# Patient Record
Sex: Female | Born: 1975 | Hispanic: Yes | Marital: Married | Smoking: Never smoker
Health system: Southern US, Community
[De-identification: ages and names within clinical notes are randomized; demographics above are authoritative.]

---

## 2005-01-16 ENCOUNTER — Ambulatory Visit: Payer: Self-pay | Admitting: Family Medicine

## 2005-03-13 ENCOUNTER — Inpatient Hospital Stay: Payer: Self-pay

## 2005-10-04 ENCOUNTER — Emergency Department: Payer: Self-pay | Admitting: Emergency Medicine

## 2010-01-09 ENCOUNTER — Emergency Department: Payer: Self-pay | Admitting: Emergency Medicine

## 2014-04-02 ENCOUNTER — Ambulatory Visit: Payer: Self-pay | Admitting: Family Medicine

## 2014-05-03 ENCOUNTER — Encounter: Payer: Self-pay | Admitting: Obstetrics and Gynecology

## 2018-07-27 ENCOUNTER — Ambulatory Visit
Admission: RE | Admit: 2018-07-27 | Discharge: 2018-07-27 | Disposition: A | Discharge status: Home / Self Care | Payer: Self-pay | Source: Ambulatory Visit | Attending: Family Medicine | Admitting: Family Medicine

## 2018-07-27 ENCOUNTER — Other Ambulatory Visit: Payer: Self-pay | Admitting: Family Medicine

## 2018-07-27 DIAGNOSIS — R0789 Other chest pain: Secondary | ICD-10-CM | POA: Insufficient documentation

## 2018-07-27 DIAGNOSIS — R079 Chest pain, unspecified: Secondary | ICD-10-CM

## 2018-08-22 ENCOUNTER — Encounter: Payer: Self-pay | Admitting: *Deleted

## 2018-08-22 ENCOUNTER — Ambulatory Visit
Admission: RE | Admit: 2018-08-22 | Discharge: 2018-08-22 | Disposition: A | Discharge status: Home / Self Care | Payer: Self-pay | Source: Ambulatory Visit | Attending: Oncology | Admitting: Oncology

## 2018-08-22 ENCOUNTER — Ambulatory Visit: Payer: Self-pay | Attending: Oncology | Admitting: *Deleted

## 2018-08-22 ENCOUNTER — Other Ambulatory Visit: Payer: Self-pay | Admitting: *Deleted

## 2018-08-22 VITALS — BP 132/93 | HR 90 | Temp 98.6°F | Ht 64.0 in | Wt 172.0 lb

## 2018-08-22 DIAGNOSIS — N63 Unspecified lump in unspecified breast: Secondary | ICD-10-CM

## 2018-08-22 DIAGNOSIS — R920 Mammographic microcalcification found on diagnostic imaging of breast: Secondary | ICD-10-CM

## 2018-08-22 NOTE — Patient Instructions (Addendum)
Gave patient hand-out, Women Staying Healthy, Active and Well from [facility], with education on breast health, pap smears, heart and colon health. 

## 2018-08-22 NOTE — Progress Notes (Signed)
  Subjective:     Patient ID: Ashley Orozco, female   DOB: Sep 20, 1976, 42 y.o.   MRN: [id number]  HPI   Review of Systems     Objective:   Physical Exam  Pulmonary/Chest: Right breast exhibits nipple discharge. Right breast exhibits no inverted nipple, no mass, no skin change and no tenderness. Left breast exhibits nipple discharge. Left breast exhibits no inverted nipple, no mass, no skin change and no tenderness.         Assessment:     42 year old Spanish speaking [place] referred to [facility] by Dr. Ephraim HamburgerMancheno at the [facility] for further evaluation of bilateral nipple discharge.  Lloyda, the interpreter present during the interview and exam.  Patient states she has had nipple discharge since the birth of her child 13 years ago.  States after breastfeeding her first child she still had a discharge on expression, and again after her last child 4 years ago.  States her husband states it taste "bitter".  Patient states it is bilateral and on expression only.  [facility] on 09/19/13 was a diagnostic mammogram for yellow nipple discharge.  It was read as a birads 1 mammogram.  Patient states her doctor did blood work and a scan and everything was normal.  She states they scanned her brain.  There are no notes from her primary care provider or results of labs or scans in Epic.  On clinical breast exam bilateral breast are tender to palpation.  There is an approximate 2 cm tender nodule at 11:00 right breast 1-2 cm from the nipple.  There is no discharge noted on exam today.  Taught self breast awareness.  She was encouraged not to express any discharge to see if it will resolve on its on.  Last pap on 03/22/18 was negative without HPV co-testing.  Next pap due in 2022.  Patient has been screened for eligibility.  She does not have any insurance, Medicare or Medicaid.  She also meets financial eligibility.  Hand-out given on the Affordable Care Act.   Risk Assessment    Risk Scores      08/22/2018   Last edited by: Jim LikeLambert,  M, RN   5-year risk: 0.3 %   Lifetime risk: 6.4 %            Plan:     Bilateral diagnostic mammogram and ultrasound ordered.  Patient encouraged to stop expressing any discharge.  Will follow-up per [facility] protocol.

## 2018-08-25 ENCOUNTER — Ambulatory Visit
Admission: RE | Admit: 2018-08-25 | Discharge: 2018-08-25 | Disposition: A | Discharge status: Home / Self Care | Payer: Self-pay | Source: Ambulatory Visit | Attending: Oncology | Admitting: Oncology

## 2018-08-25 DIAGNOSIS — R920 Mammographic microcalcification found on diagnostic imaging of breast: Secondary | ICD-10-CM

## 2018-08-25 HISTORY — PX: BREAST BIOPSY: SHX20

## 2018-08-26 ENCOUNTER — Encounter: Payer: Self-pay | Admitting: Family Medicine

## 2018-08-26 LAB — SURGICAL PATHOLOGY

## 2018-09-06 ENCOUNTER — Encounter: Payer: Self-pay | Admitting: *Deleted

## 2018-09-06 NOTE — Progress Notes (Signed)
Called patient via Onalee HuaDavid #[phone] on the Language Line to inform patient of her benign breast biopsy.  She is to return in one year for her annual screening.  [facility] to [place].

## 2018-10-03 ENCOUNTER — Encounter: Payer: Self-pay | Admitting: *Deleted

## 2018-10-03 ENCOUNTER — Ambulatory Visit: Payer: Self-pay | Attending: Oncology | Admitting: *Deleted

## 2018-10-03 ENCOUNTER — Encounter (INDEPENDENT_AMBULATORY_CARE_PROVIDER_SITE_OTHER): Payer: Self-pay

## 2018-10-03 VITALS — BP 121/75 | HR 82 | Temp 98.0°F | Ht 65.25 in | Wt 171.8 lb

## 2018-10-03 DIAGNOSIS — N644 Mastodynia: Secondary | ICD-10-CM

## 2018-10-03 NOTE — Progress Notes (Signed)
  Subjective:     Patient ID: Ashley Orozco, female   DOB: [birth year], 43 y.o.   MRN: [id number]  HPI   Review of Systems     Objective:   Physical Exam Chest:          Assessment:     43 year old Hispanic female returns to [facility] with c/o right breast pain.  Lloyda, the interpreter present during the interview and exam.  Patient was seen through our [facility] program on 08/22/18 as a referral from [facility] for bilateral nipple discharge, which the patient stated she has had for 13 years.  On clinical breast exam at that time a 2 cm nodule at 11:00 right breast was noted.  It was noted as a cluster of cysts on ultrasound.  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  Patient states she has had continuous right breast pain at the site of the noted cysts.  States the lump got larger and more painful prior to her period, and currently is smaller again. States the pain is sometimes a 7 on a 0/10 scale.  Explained that the waxing and waning of the size is normal.  Reviewed her ultrasound results showing a cluster of small cysts.  States "what can be done?".  Informed that sometimes with larger cyst if they are painful the fluid can be aspirated.  Informed patient that I am not sure with the small cluster of cyst if that is even possible.  She is pretty adamant she would like to have something done about the pain.  Reviewed warm compresses, decreasing salt intake or option to see a surgeon for further evaluation.  She would like to see a Careers adviser.      Plan:     Joellyn Quails to schedule patient to see Dr. Lemar Livings for further evaluation of right breast pain at the site of noted cysts.  Will follow-up per [facility] protocol.

## 2018-11-08 ENCOUNTER — Ambulatory Visit: Payer: Self-pay | Admitting: General Surgery

## 2018-11-10 ENCOUNTER — Encounter: Payer: Self-pay | Admitting: *Deleted

## 2018-11-10 NOTE — Progress Notes (Signed)
Per appointment notes.  The patient cancelled her appointment with Dr. Lemar Livings and did not want to reschedule.  Patient can call if she needs further assistance.

## 2019-03-09 ENCOUNTER — Encounter: Payer: Self-pay | Admitting: General Surgery

## 2020-02-07 ENCOUNTER — Other Ambulatory Visit: Payer: Self-pay | Admitting: *Deleted

## 2020-02-07 ENCOUNTER — Ambulatory Visit
Admission: RE | Admit: 2020-02-07 | Discharge: 2020-02-07 | Disposition: A | Discharge status: Home / Self Care | Payer: Self-pay | Source: Ambulatory Visit | Attending: Oncology | Admitting: Oncology

## 2020-02-07 ENCOUNTER — Ambulatory Visit: Payer: Self-pay | Attending: Oncology | Admitting: *Deleted

## 2020-02-07 ENCOUNTER — Encounter: Payer: Self-pay | Admitting: *Deleted

## 2020-02-07 ENCOUNTER — Other Ambulatory Visit: Payer: Self-pay

## 2020-02-07 VITALS — BP 123/81 | HR 90 | Temp 98.1°F | Ht 65.5 in | Wt 179.0 lb

## 2020-02-07 DIAGNOSIS — Z0000 Encounter for general adult medical examination without abnormal findings: Secondary | ICD-10-CM

## 2020-02-07 DIAGNOSIS — N63 Unspecified lump in unspecified breast: Secondary | ICD-10-CM

## 2020-02-07 NOTE — Progress Notes (Signed)
  Subjective:     Patient ID: Ashley Orozco, female   DOB: [birth year], 44 y.o.   MRN: [id number]  HPI   Review of Systems     Objective:   Physical Exam Chest:     Breasts: Breasts are asymmetrical.        Right: Tenderness present. No swelling, bleeding, inverted nipple, mass, nipple discharge or skin change.        Left: Tenderness present. No swelling, bleeding, inverted nipple, mass, nipple discharge or skin change.    Lymphadenopathy:     Upper Body:     Right upper body: No supraclavicular or axillary adenopathy.     Left upper body: No supraclavicular or axillary adenopathy.        Assessment:     44 year old Hispanic female returns to [facility] for annual screening.  Loyda, the interpreter present during the interview and exam.  Clinical breast exam with tenderness to palpation.  No dominant mass, skin changes, nipple discharge or lymphadenopathy.  Taught self breast awareness.  Patient encouraged to decrease caffeine intake. Last pap on 03/22/18 was negative without HPV co-testing.  Next pap due in 2022. Patient has been screened for eligibility.  She does not have any insurance, Medicare or Medicaid.  She also meets financial eligibility.   Risk Assessment    Risk Scores      02/07/2020 08/22/2018   Last edited by: Alta Corning, CMA Jim Like, RN   5-year risk: 0.4 % 0.3 %   Lifetime risk: 6.3 % 6.4 %            Plan:     Screening mammogram ordered.

## 2020-02-07 NOTE — Patient Instructions (Signed)
Gave patient hand-out, Women Staying Healthy, Active and Well from [facility], with education on breast health, pap smears, heart and colon health. 

## 2020-02-09 ENCOUNTER — Other Ambulatory Visit: Payer: Self-pay

## 2020-02-09 DIAGNOSIS — N63 Unspecified lump in unspecified breast: Secondary | ICD-10-CM

## 2020-02-16 ENCOUNTER — Ambulatory Visit
Admission: RE | Admit: 2020-02-16 | Discharge: 2020-02-16 | Disposition: A | Discharge status: Home / Self Care | Payer: Self-pay | Source: Ambulatory Visit | Attending: Medical | Admitting: Medical

## 2020-02-16 DIAGNOSIS — N63 Unspecified lump in unspecified breast: Secondary | ICD-10-CM

## 2020-02-20 ENCOUNTER — Other Ambulatory Visit: Payer: Self-pay | Admitting: *Deleted

## 2020-02-20 DIAGNOSIS — N63 Unspecified lump in unspecified breast: Secondary | ICD-10-CM

## 2020-02-21 ENCOUNTER — Other Ambulatory Visit: Payer: Self-pay | Admitting: Medical

## 2020-02-26 ENCOUNTER — Ambulatory Visit
Admission: RE | Admit: 2020-02-26 | Discharge: 2020-02-26 | Disposition: A | Discharge status: Home / Self Care | Payer: Self-pay | Source: Ambulatory Visit | Attending: Oncology | Admitting: Oncology

## 2020-02-26 DIAGNOSIS — N63 Unspecified lump in unspecified breast: Secondary | ICD-10-CM

## 2020-02-27 LAB — SURGICAL PATHOLOGY

## 2020-02-29 ENCOUNTER — Encounter: Payer: Self-pay | Admitting: *Deleted

## 2020-02-29 NOTE — Progress Notes (Signed)
Called patient via Orson Slick, the interpreter.  Gave patient her benign biopsy results.  She was encouraged to return in 1 year for her annual screening.

## 2020-06-05 ENCOUNTER — Other Ambulatory Visit: Payer: Self-pay

## 2020-06-05 DIAGNOSIS — Y93F2 Activity, caregiving, lifting: Secondary | ICD-10-CM | POA: Diagnosis not present

## 2020-06-05 DIAGNOSIS — Y9289 Other specified places as the place of occurrence of the external cause: Secondary | ICD-10-CM | POA: Diagnosis not present

## 2020-06-05 DIAGNOSIS — X500XXA Overexertion from strenuous movement or load, initial encounter: Secondary | ICD-10-CM | POA: Insufficient documentation

## 2020-06-05 DIAGNOSIS — S39012A Strain of muscle, fascia and tendon of lower back, initial encounter: Secondary | ICD-10-CM | POA: Insufficient documentation

## 2020-06-05 DIAGNOSIS — S3992XA Unspecified injury of lower back, initial encounter: Secondary | ICD-10-CM | POA: Diagnosis present

## 2020-06-05 NOTE — ED Triage Notes (Signed)
Pt was lifting heavy object at work and is now having lower back pain. States injury happened today.

## 2020-06-05 NOTE — ED Notes (Signed)
Pt wishes to file [employer]; pt is accompanied by supervisor, Tommie Raymond who states UDS is only testing needed for [employer].

## 2020-06-06 ENCOUNTER — Emergency Department
Admission: EM | Admit: 2020-06-06 | Discharge: 2020-06-06 | Disposition: A | Discharge status: Home / Self Care | Payer: Worker's Compensation | Attending: Emergency Medicine | Admitting: Emergency Medicine

## 2020-06-06 DIAGNOSIS — S39012A Strain of muscle, fascia and tendon of lower back, initial encounter: Secondary | ICD-10-CM

## 2020-06-06 MED ORDER — IBUPROFEN 800 MG PO TABS: 800.0000 mg | ORAL_TABLET | Freq: Three times a day (TID) | ORAL | 0 refills | Status: AC | PRN

## 2020-06-06 MED ORDER — CYCLOBENZAPRINE HCL 5 MG PO TABS: ORAL_TABLET | ORAL | 0 refills | Status: AC

## 2020-06-06 MED ORDER — KETOROLAC TROMETHAMINE 60 MG/2ML IM SOLN
30.0000 mg | Freq: Once | INTRAMUSCULAR | Status: AC
  Administered 2020-06-06: 30 mg via INTRAMUSCULAR
  Filled 2020-06-06: qty 2

## 2020-06-06 NOTE — Discharge Instructions (Signed)
1.  You may take medicines as needed for pain and muscle spasms (Motrin/Flexeril #15). °2.  Apply moist heat to affected area several times daily. °3.  Return to the ER for worsening symptoms, persistent vomiting, difficulty breathing or other concerns. °

## 2020-06-06 NOTE — ED Provider Notes (Signed)
[facility] Emergency Department Provider Note   ____________________________________________   First MD Initiated Contact with Patient 06/06/20 0254     (approximate)  I have reviewed the triage vital signs and the nursing notes.   HISTORY  Chief Complaint Back Pain  History obtained via tele-Spanish interpreter  HPI [facility] Ashley Orozco is a 44 y.o. female who presents to the ED from work with a chief complaint of low back pain.  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  Pain and muscle spasms progressed throughout the evening.  Denies fall.  Denies extremity weakness, bowel or bladder incontinence.      Past medical history None  There are no problems to display for this patient.   Past Surgical History:  Procedure Laterality Date  . BREAST BIOPSY Left 08/25/2018   Affirm Bx("x" clip)FIBROADENOMA WITH CALCIFICATIONS    Prior to Admission medications   Medication Sig Start Date End Date Taking? Authorizing Provider  cyclobenzaprine (FLEXERIL) 5 MG tablet 1 tablet every 8 hours as he did for muscle spasms 06/06/20   Irean Hong, MD  ibuprofen (ADVIL) 800 MG tablet Take 1 tablet (800 mg total) by mouth every 8 (eight) hours as needed for moderate pain. 06/06/20   Irean Hong, MD    Allergies Patient has no known allergies.  Family History  Problem Relation Age of Onset  . Breast cancer Neg Hx     Social History Social History   Tobacco Use  . Smoking status: Not on file  Substance Use Topics  . Alcohol use: Not on file  . Drug use: Not on file    Review of Systems  Constitutional: No fever/chills Eyes: No visual changes. ENT: No sore throat. Cardiovascular: Denies chest pain. Respiratory: Denies shortness of breath. Gastrointestinal: No abdominal pain.  No nausea, no vomiting.  No diarrhea.  No constipation. Genitourinary: Negative for  dysuria. Musculoskeletal: Positive for back pain. Skin: Negative for rash. Neurological: Negative for headaches, focal weakness or numbness.   ____________________________________________   PHYSICAL EXAM:  VITAL SIGNS: ED Triage Vitals  Enc Vitals Group     BP 06/05/20 2249 130/80     Pulse Rate 06/05/20 2249 83     Resp 06/05/20 2249 18     Temp 06/05/20 2249 98.5 F (36.9 C)     Temp Source 06/05/20 2249 Oral     SpO2 06/05/20 2249 97 %     Weight 06/05/20 2249 178 lb 9.2 oz (81 kg)     Height 06/05/20 2302 5\' 4"  (1.626 m)     Head Circumference --      Peak Flow --      Pain Score 06/05/20 2302 6     Pain Loc --      Pain Edu? --      Excl. in GC? --     Constitutional: Alert and oriented. Well appearing and in no acute distress. Eyes: Conjunctivae are normal. PERRL. EOMI. Head: Atraumatic. Nose: Atraumatic. Mouth/Throat: Mucous membranes are moist.  No dental malocclusion.   Neck: No stridor.  No cervical spine tenderness to palpation. Cardiovascular: Normal rate, regular rhythm. Grossly normal heart sounds.  Good peripheral circulation. Respiratory: Normal respiratory effort.  No retractions. Lungs CTAB. Gastrointestinal: Soft and nontender. No distention. No abdominal bruits. No CVA tenderness. Musculoskeletal: No spinal tenderness to palpation.  Paraspinal lumbar muscle spasms.  Negative straight leg raise.  No lower  extremity tenderness nor edema.  No joint effusions. Neurologic:  Normal speech and language. No gross focal neurologic deficits are appreciated. No gait instability. Skin:  Skin is warm, dry and intact. No rash noted. Psychiatric: Mood and affect are normal. Speech and behavior are normal.  ____________________________________________   LABS (all labs ordered are listed, but only abnormal results are displayed)  Labs Reviewed - No data to  display ____________________________________________  EKG  None ____________________________________________  RADIOLOGY  ED MD interpretation: None  Official radiology report(s): No results found.  ____________________________________________   PROCEDURES  Procedure(s) performed (including Critical Care):  Procedures   ____________________________________________   INITIAL IMPRESSION / ASSESSMENT AND PLAN / ED COURSE  As part of my medical decision making, I reviewed the following data within the electronic MEDICAL RECORD NUMBER Nursing notes reviewed and incorporated, Old chart reviewed, Notes from prior ED visits         44 year old female presenting with lumbar strain without focal neurological deficits.  Will treat with NSAIDs, Flexeril and follow-up with orthopedics as needed.  Strict return precautions given.  Patient verbalizes understanding agrees with plan of care.      ____________________________________________   FINAL CLINICAL IMPRESSION(S) / ED DIAGNOSES  Final diagnoses:  Strain of lumbar region, initial encounter     ED Discharge Orders         Ordered    ibuprofen (ADVIL) 800 MG tablet  Every 8 hours PRN        06/06/20 0305    cyclobenzaprine (FLEXERIL) 5 MG tablet        06/06/20 0305          *Please note:  Ashley Orozco was evaluated in Emergency Department on 06/06/2020 for the symptoms described in the history of present illness. She was evaluated in the context of the global COVID-19 pandemic, which necessitated consideration that the patient might be at risk for infection with the SARS-CoV-2 virus that causes COVID-19. Institutional protocols and algorithms that pertain to the evaluation of patients at risk for COVID-19 are in a state of rapid change based on information released by regulatory bodies including the CDC and federal and state organizations. These policies and algorithms were followed during the patient's  care in the ED.  Some ED evaluations and interventions may be delayed as a result of limited staffing during and the pandemic.*   Note:  This document was prepared using Dragon voice recognition software and may include unintentional dictation errors.   Irean Hong, MD 06/06/20 [phone]

## 2020-06-06 NOTE — ED Notes (Signed)
Patient discharged to home per MD order. Patient in stable condition, and deemed medically cleared by ED provider for discharge. Discharge instructions reviewed with patient/family using "Teach Back"; verbalized understanding of medication education and administration, and information about follow-up care. Denies further concerns. ° °

## 2020-06-06 NOTE — ED Notes (Signed)
Performed Workers' Comp Urine Drug Screen.AS

## 2021-08-21 IMAGING — MG MM DIGITAL DIAGNOSTIC UNILAT*R* W/ TOMO W/ CAD
4 series · 4 of 12 positions shown · non-contrast
Comparison: Previous exam(s).

CLINICAL DATA: Patient recalled from screening for right breast
mass.

EXAM:
DIGITAL DIAGNOSTIC RIGHT MAMMOGRAM WITH CAD AND TOMO
ULTRASOUND RIGHT BREAST

[R CC synth-2D]
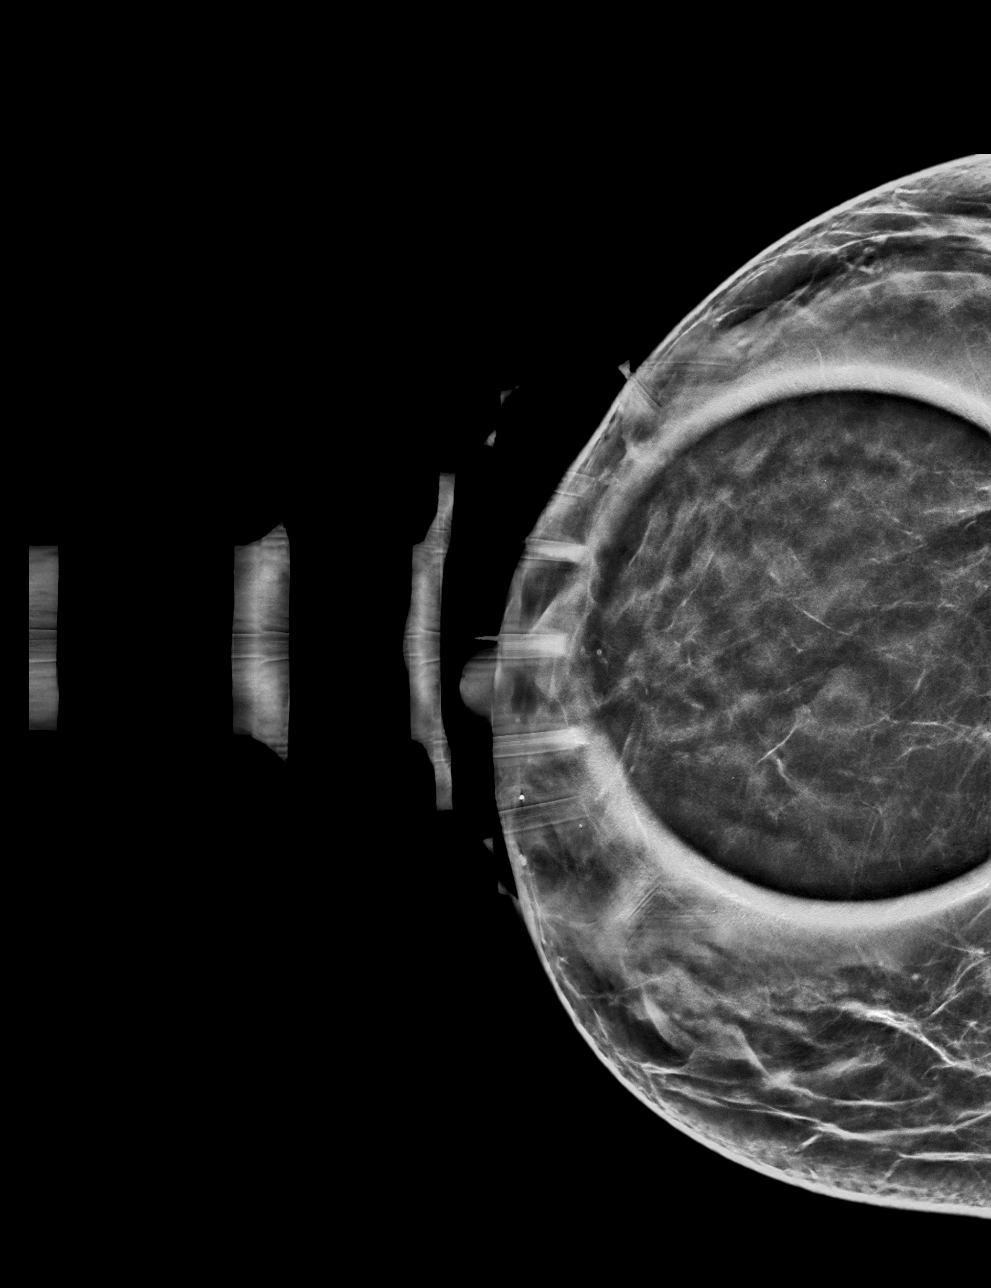

[R MLO synth-2D]
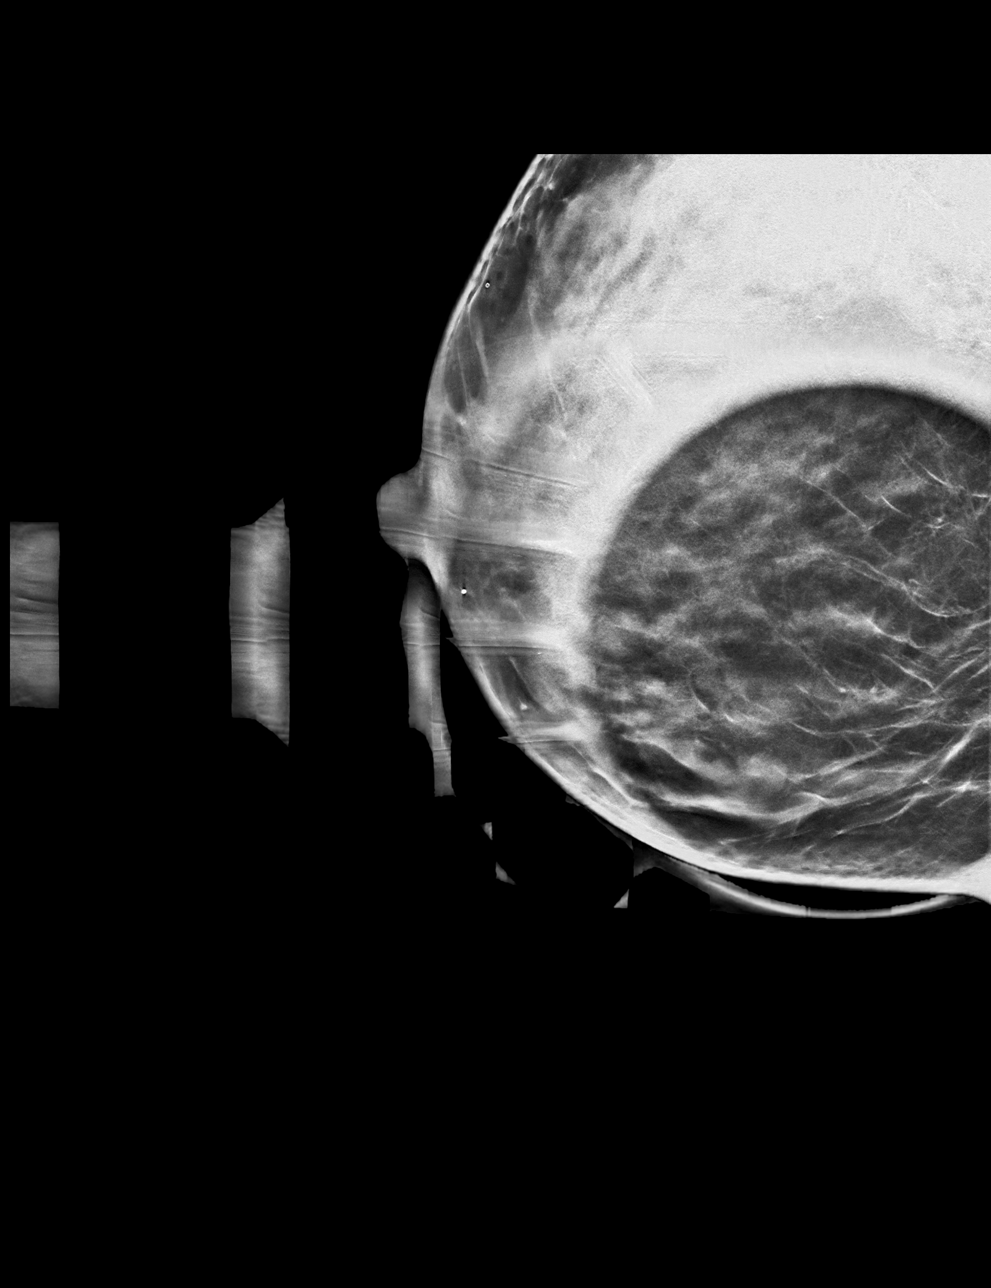

[R MLO tomo · tomo slice 33/64.0]
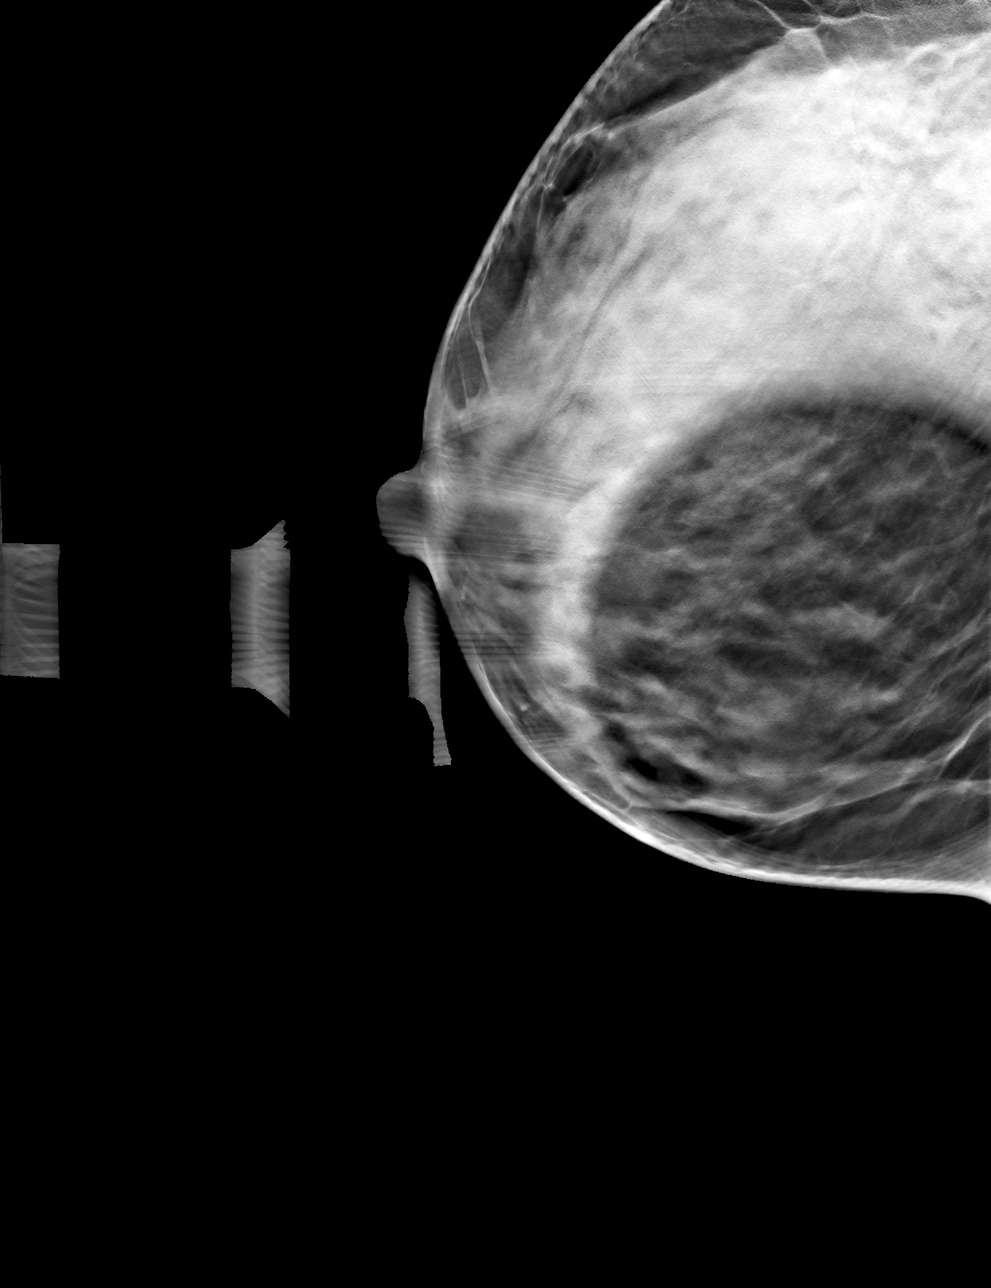

[R CC tomo · tomo slice 33/64.0]
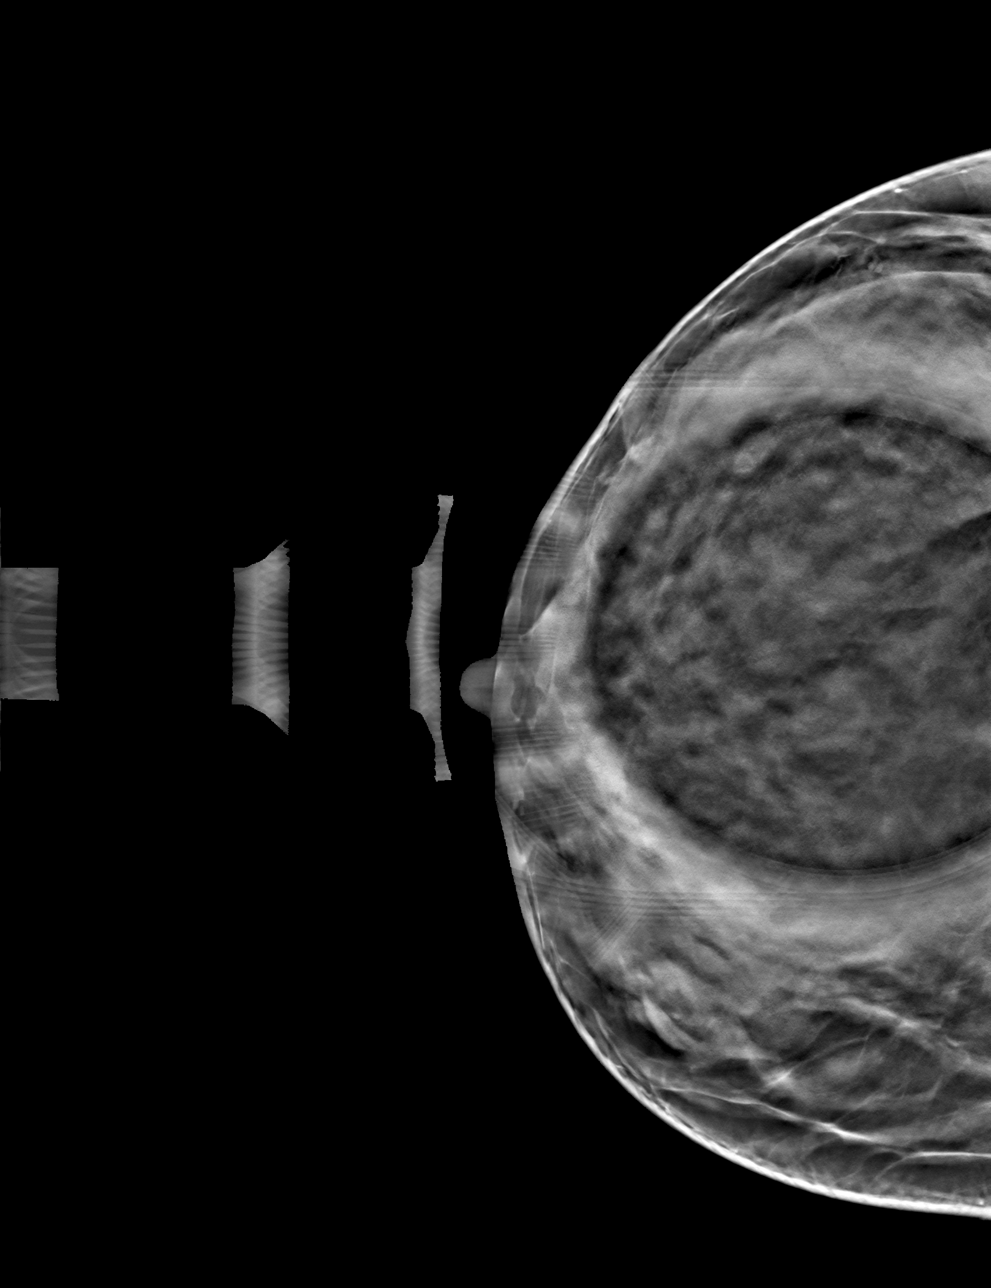

[4 of 12 positions shown; findings below may reference images not displayed]

ACR Breast Density Category c: The breast tissue is heterogeneously
dense, which may obscure small masses.
FINDINGS: Within the inferior right breast middle depth there is a persistent
lobular circumscribed mass further evaluated spot compression CC and
MLO tomosynthesis images.

Mammographic images were processed with CAD.

Targeted ultrasound is performed, showing a 1.0 x 0.5 x 1.5 cm
lobular hypoechoic mass right breast 6 o'clock position 4 cm from
the nipple.

No right axillary adenopathy.
IMPRESSION: Indeterminate right breast mass 6 o'clock position.

RECOMMENDATION:
Ultrasound-guided core needle biopsy indeterminate right breast mass
6 o'clock position.

I have discussed the findings and recommendations with the patient.
If applicable, a reminder letter will be sent to the patient
regarding the next appointment.

BI-RADS CATEGORY  4: Suspicious.

## 2021-08-21 IMAGING — US US BREAST*R* LIMITED INC AXILLA
1 series · 11 of 11 positions shown · non-contrast
Comparison: Previous exam(s).

CLINICAL DATA: Patient recalled from screening for right breast
mass.

EXAM:
DIGITAL DIAGNOSTIC RIGHT MAMMOGRAM WITH CAD AND TOMO
ULTRASOUND RIGHT BREAST

[Series 1: us breast*right* limited inc axilla · 0.05mm/px · 11 of 11 slices shown]
[im 1/11]
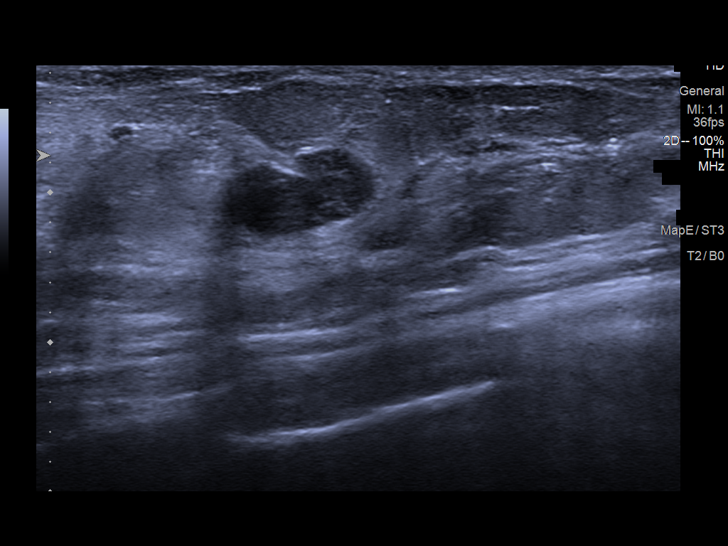
[im 2/11]
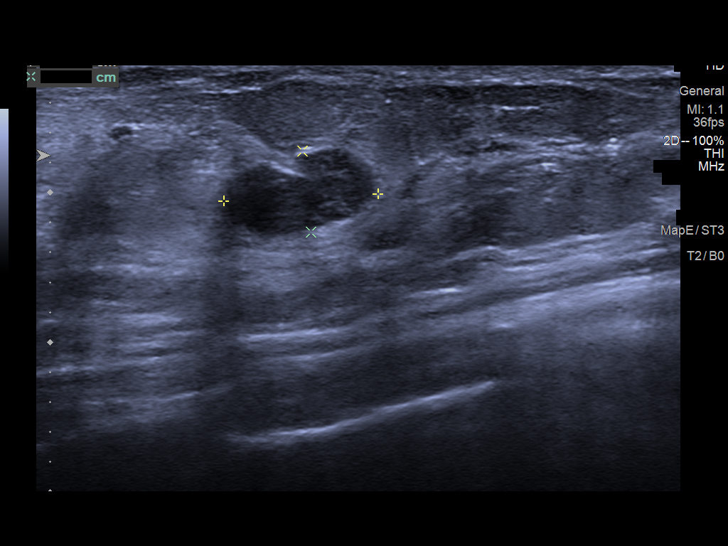
[im 3/11]
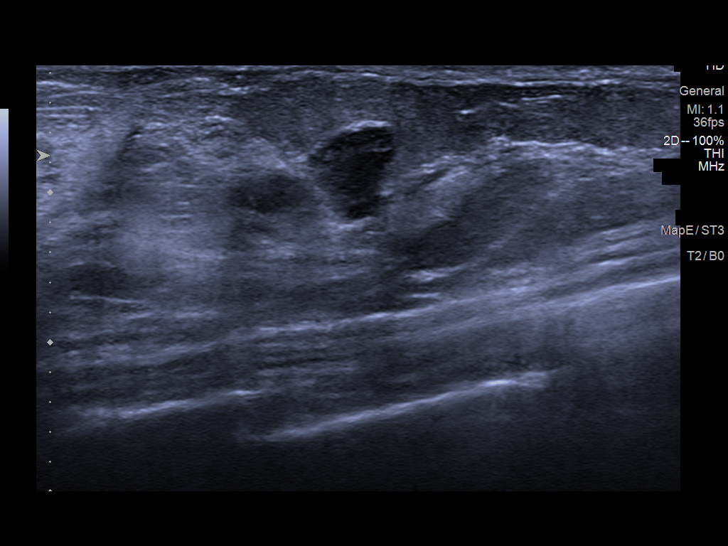
[im 4/11]
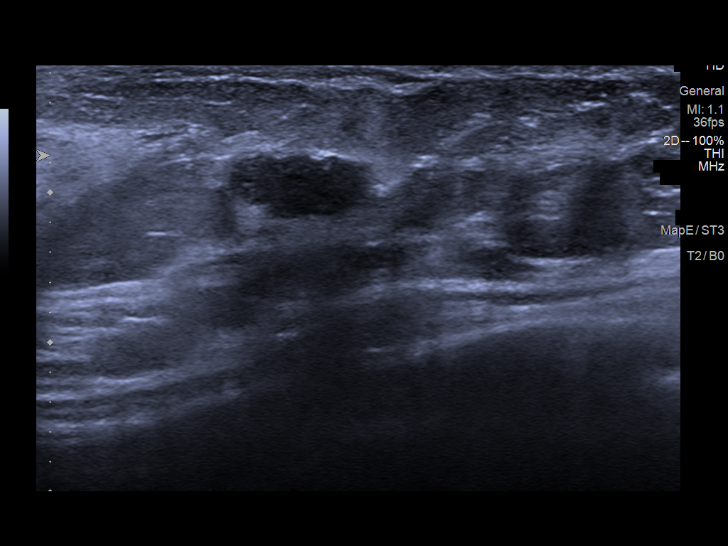
[im 5/11]
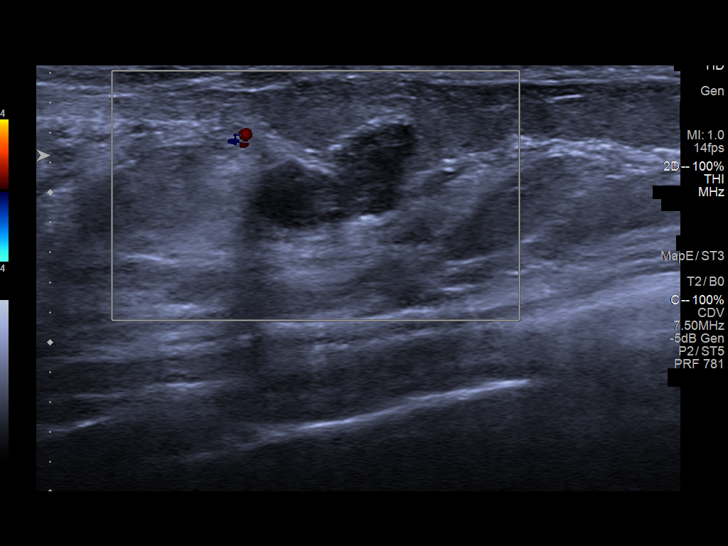
[im 6/11]
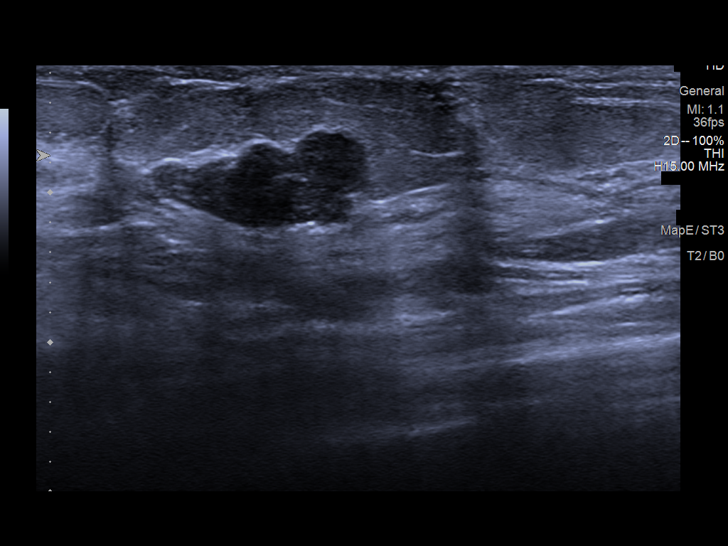
[im 7/11]
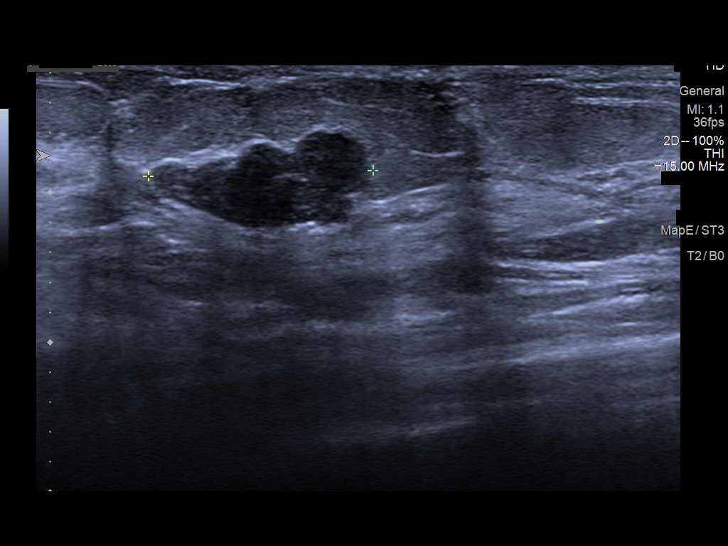
[im 8/11]
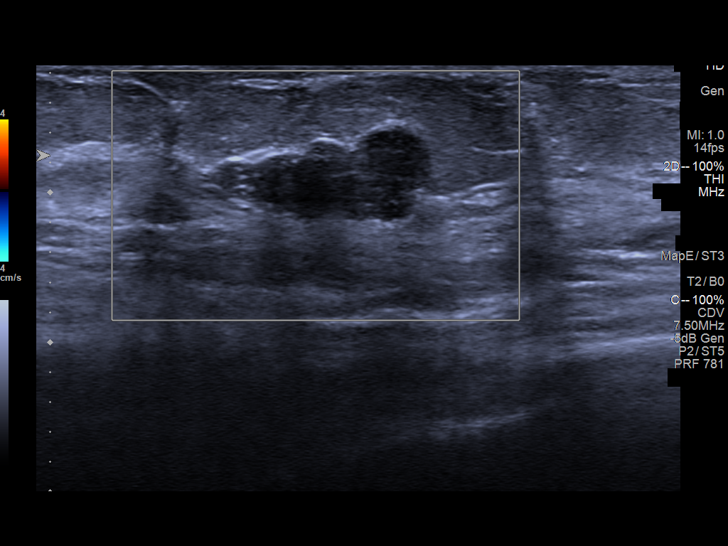
[im 9/11]
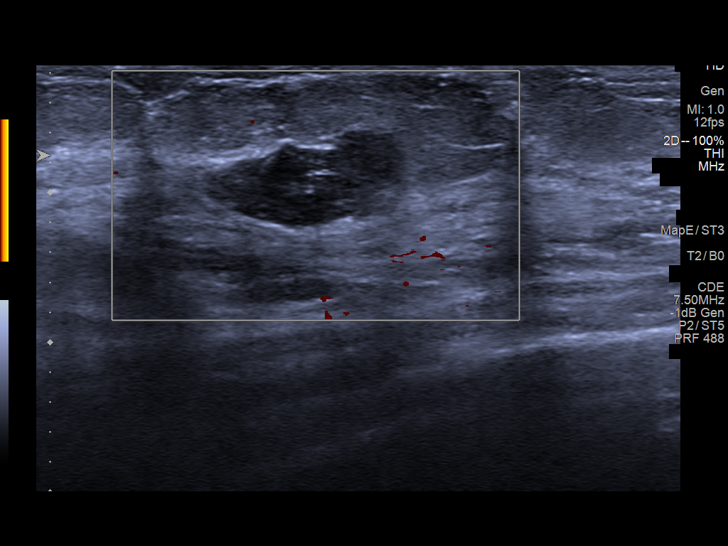
[im 10/11]
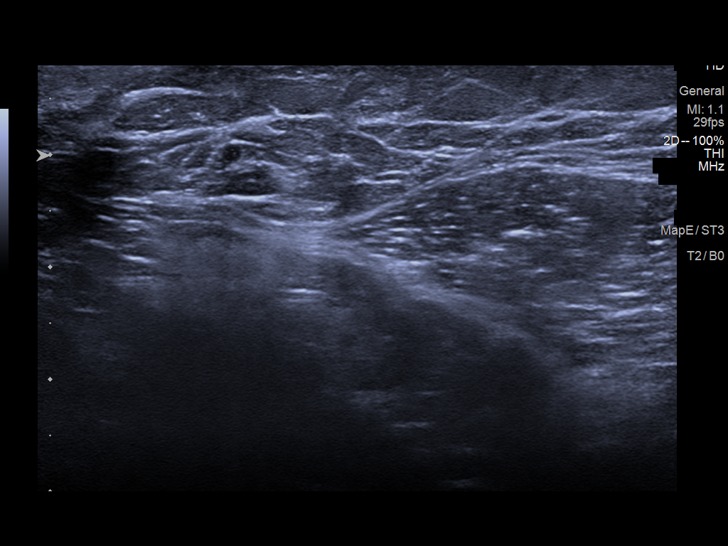
[im 11/11]
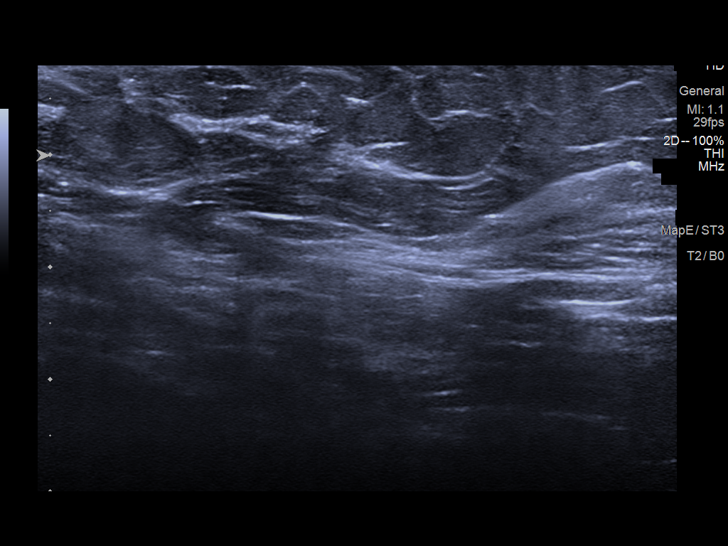

[11 of 11 positions shown; findings below may reference images not displayed]

ACR Breast Density Category c: The breast tissue is heterogeneously
dense, which may obscure small masses.
FINDINGS: Within the inferior right breast middle depth there is a persistent
lobular circumscribed mass further evaluated spot compression CC and
MLO tomosynthesis images.

Mammographic images were processed with CAD.

Targeted ultrasound is performed, showing a 1.0 x 0.5 x 1.5 cm
lobular hypoechoic mass right breast 6 o'clock position 4 cm from
the nipple.

No right axillary adenopathy.
IMPRESSION: Indeterminate right breast mass 6 o'clock position.

RECOMMENDATION:
Ultrasound-guided core needle biopsy indeterminate right breast mass
6 o'clock position.

I have discussed the findings and recommendations with the patient.
If applicable, a reminder letter will be sent to the patient
regarding the next appointment.

BI-RADS CATEGORY  4: Suspicious.

## 2023-10-18 ENCOUNTER — Other Ambulatory Visit: Payer: Self-pay

## 2023-10-18 ENCOUNTER — Emergency Department: Payer: Self-pay

## 2023-10-18 ENCOUNTER — Emergency Department
Admission: EM | Admit: 2023-10-18 | Discharge: 2023-10-18 | Disposition: A | Discharge status: Home / Self Care | Payer: Self-pay | Attending: Student in an Organized Health Care Education/Training Program | Admitting: Student in an Organized Health Care Education/Training Program

## 2023-10-18 DIAGNOSIS — R079 Chest pain, unspecified: Secondary | ICD-10-CM | POA: Insufficient documentation

## 2023-10-18 LAB — BASIC METABOLIC PANEL
Anion gap: 8 (ref 5–15)
BUN: 16 mg/dL (ref 6–20)
CO2: 25 mmol/L (ref 22–32)
Calcium: 8.8 mg/dL — ABNORMAL LOW (ref 8.9–10.3)
Chloride: 103 mmol/L (ref 98–111)
Creatinine, Ser: 0.64 mg/dL (ref 0.44–1.00)
GFR, Estimated: >60 mL/min (ref >60)
Glucose, Bld: 150 mg/dL — ABNORMAL HIGH (ref 70–99)
Potassium: 3.8 mmol/L (ref 3.5–5.1)
Sodium: 136 mmol/L (ref 135–145)

## 2023-10-18 LAB — CBC
HCT: 33.7 % — ABNORMAL LOW (ref 36.0–46.0)
Hemoglobin: 11.3 g/dL — ABNORMAL LOW (ref 12.0–15.0)
MCH: 28.7 pg (ref 26.0–34.0)
MCHC: 33.5 g/dL (ref 30.0–36.0)
MCV: 85.5 fL (ref 80.0–100.0)
Platelets: 210 10*3/uL (ref 150–400)
RBC: 3.94 MIL/uL (ref 3.87–5.11)
RDW: 12.7 % (ref 11.5–15.5)
WBC: 8 10*3/uL (ref 4.0–10.5)
nRBC: 0 % (ref 0.0–0.2)

## 2023-10-18 LAB — TROPONIN I (HIGH SENSITIVITY)
Troponin I (High Sensitivity): 3 ng/L (ref <18)
Troponin I (High Sensitivity): 4 ng/L (ref <18)

## 2023-10-18 LAB — POC URINE PREG, ED: Preg Test, Ur: NEGATIVE

## 2023-10-18 MED ORDER — NAPROXEN 500 MG PO TABS
500.0000 mg | ORAL_TABLET | Freq: Once | ORAL | Status: AC
  Administered 2023-10-18: 500 mg via ORAL
  Filled 2023-10-18: qty 1

## 2023-10-18 NOTE — ED Provider Notes (Signed)
[facility] Provider Note    Event Date/Time   First MD Initiated Contact with Patient 10/18/23 [phone]     (approximate)   History   Chest Pain   HPI  [facility] Ashley Orozco is a 48 y.o. female presents to the ER for valuation than 24 hours of left anterior chest wall pain.  States that it is waxing and waning in nature very mild discomfort.  No associated diaphoresis nausea or vomiting.  No pleuritic chest pain.  No shortness of breath.  Has been under a lot of stress at home lately.  Denies any history of smoking, hypertension, hyperlipidemia or cardiac disease.     Physical Exam   Triage Vital Signs: ED Triage Vitals [10/18/23 0538]  Encounter Vitals Group     BP (!) 153/86     Systolic BP Percentile      Diastolic BP Percentile      Pulse Rate 88     Resp 15     Temp 98.3 F (36.8 C)     Temp Source Oral     SpO2 99 %     Weight      Height      Head Circumference      Peak Flow      Pain Score 7     Pain Loc      Pain Education      Exclude from Growth Chart     Most recent vital signs: Vitals:   10/18/23 0538  BP: (!) 153/86  Pulse: 88  Resp: 15  Temp: 98.3 F (36.8 C)  SpO2: 99%     Constitutional: Alert  Eyes: Conjunctivae are normal.  Head: Atraumatic. Nose: No congestion/rhinnorhea. Mouth/Throat: Mucous membranes are moist.   Neck: Painless ROM.  Cardiovascular:   Good peripheral circulation. Respiratory: Normal respiratory effort.  No retractions.  Gastrointestinal: Soft and nontender.  Musculoskeletal:  no deformity Neurologic:  MAE spontaneously. No gross focal neurologic deficits are appreciated.  Skin:  Skin is warm, dry and intact. No rash noted. Psychiatric: Mood and affect are normal. Speech and behavior are normal.    ED Results / Procedures / Treatments   Labs (all labs ordered are listed, but only abnormal results are displayed) Labs Reviewed  BASIC METABOLIC PANEL - Abnormal; Notable for  the following components:      Result Value   Glucose, Bld 150 (*)    Calcium 8.8 (*)    All other components within normal limits  CBC - Abnormal; Notable for the following components:   Hemoglobin 11.3 (*)    HCT 33.7 (*)    All other components within normal limits  POC URINE PREG, ED  TROPONIN I (HIGH SENSITIVITY)  TROPONIN I (HIGH SENSITIVITY)     EKG  ED ECG REPORT I, Willy Eddy, the attending physician, personally viewed and interpreted this ECG.   Date: 10/18/2023  EKG Time: 5:39  Rate: 90  Rhythm: sinus  Axis: normal  Intervals: normal  ST&T Change: no stemi, no depression    RADIOLOGY Please see ED Course for my review and interpretation.  I personally reviewed all radiographic images ordered to evaluate for the above acute complaints and reviewed radiology reports and findings.  These findings were personally discussed with the patient.  Please see medical record for radiology report.    PROCEDURES:  Critical Care performed:   Procedures   MEDICATIONS ORDERED IN ED: Medications  naproxen (NAPROSYN) tablet 500 mg (500 mg Oral  Given 10/18/23 0926)     IMPRESSION / MDM / ASSESSMENT AND PLAN / ED COURSE  I reviewed the triage vital signs and the nursing notes.                              Differential diagnosis includes, but is not limited to, ACS, pericarditis, esophagitis, boerhaaves, pe, dissection, pna, bronchitis, costochondritis  Patient presenting to the ER for evaluation of symptoms as described above.  Based on symptoms, risk factors and considered above differential, this presenting complaint could reflect a potentially life-threatening illness therefore the patient will be placed on continuous pulse oximetry and telemetry for monitoring.  Laboratory evaluation will be sent to evaluate for the above complaints.  Patient is low risk by Wells criteria and PERC negative.  She is well-appearing in no acute distress.  Little bit of discomfort  reproducible with palpation.  Chest x-ray on my review and interpretation without evidence of infiltrate  Repeat troponin negative.  Patient remains well-appearing no acute distress.  Does appear stable and appropriate for outpatient referral.       FINAL CLINICAL IMPRESSION(S) / ED DIAGNOSES   Final diagnoses:  Nonspecific chest pain     Rx / DC Orders   ED Discharge Orders          Ordered    Ambulatory referral to Cardiology       Comments: If you have not heard from the Cardiology office within the next 72 hours please call [phone].   10/18/23 1023             Note:  This document was prepared using Dragon voice recognition software and may include unintentional dictation errors.    Willy Eddy, MD 10/18/23 1024

## 2023-10-18 NOTE — ED Triage Notes (Addendum)
Patient states yesterday starting at 6pm she had intermittent, left sided chest pain. Chest pain rated 7/10. No blood thinners. Does not see a cardiologist. No shortness of breath or nausea with the chest pain.

## 2023-10-26 ENCOUNTER — Telehealth: Payer: Self-pay

## 2023-10-26 NOTE — Progress Notes (Signed)
 Transition Care Management Unsuccessful Follow-up Telephone Call  Date of discharge and from where:  10/18/2023 [facility]  Attempts:  1st Attempt  Reason for unsuccessful TCM follow-up call:  Left voice message called with assistance from [employer] ID [phone].  Ashley Orozco [facility]  [facility] Guide Direct Dial: [phone]  Fax: [phone] Website: [email or web address]

## 2023-10-27 ENCOUNTER — Telehealth: Payer: Self-pay

## 2023-10-27 NOTE — Progress Notes (Signed)
 Transition Care Management Follow-up Telephone Call Date of discharge and from where: 10/18/2023 [facility]. Called with assistance from Ashley Orozco ID [phone] How have you been since you were released from the hospital? Patient stated she is feeling much better. Any questions or concerns? No  Items Reviewed: Did the pt receive and understand the discharge instructions provided? Yes  Medications obtained and verified? Yes  Other? No  Any new allergies since your discharge? No  Dietary orders reviewed? Yes Do you have support at home? Yes   Follow up appointments reviewed:  PCP Hospital f/u appt confirmed? No  Scheduled to see  on  @ . Specialist Hospital f/u appt confirmed? Yes  Scheduled to see [name], MD on 01/03/2024 @ [facility] [place]. Are transportation arrangements needed? No  If their condition worsens, is the pt aware to call PCP or go to the Emergency Dept.? Yes Was the patient provided with contact information for the PCP's office or ED? Yes Was to pt encouraged to call back with questions or concerns? Yes   Ashley Orozco [facility]  [facility] Guide Direct Dial: [phone]  Fax: [phone] Website: [email or web address]

## 2024-01-03 ENCOUNTER — Ambulatory Visit: Payer: Self-pay | Attending: Cardiology | Admitting: Cardiology

## 2024-01-05 NOTE — Progress Notes (Signed)
 This encounter was created in error - please disregard.

## 2024-12-11 ENCOUNTER — Ambulatory Visit: Payer: Self-pay
